# Patient Record
Sex: Female | Born: 1975 | Hispanic: Yes | Marital: Married | Smoking: Never smoker
Health system: Southern US, Community
[De-identification: ages and names within clinical notes are randomized; demographics above are authoritative.]

## PROBLEM LIST (undated history)

## (undated) DIAGNOSIS — R109 Unspecified abdominal pain: Secondary | ICD-10-CM

## (undated) DIAGNOSIS — K56609 Unspecified intestinal obstruction, unspecified as to partial versus complete obstruction: Secondary | ICD-10-CM

## (undated) DIAGNOSIS — Z8719 Personal history of other diseases of the digestive system: Secondary | ICD-10-CM

## (undated) DIAGNOSIS — K859 Acute pancreatitis without necrosis or infection, unspecified: Secondary | ICD-10-CM

## (undated) DIAGNOSIS — J45909 Unspecified asthma, uncomplicated: Secondary | ICD-10-CM

## (undated) DIAGNOSIS — G8929 Other chronic pain: Secondary | ICD-10-CM

## (undated) HISTORY — PX: LAPAROSCOPIC REVISION OF GASTRIC BAND: SHX5921

## (undated) HISTORY — PX: CHOLECYSTECTOMY: SHX55

## (undated) HISTORY — PX: LAPAROSCOPIC REPAIR AND REMOVAL OF GASTRIC BAND: SHX5919

---

## 2013-12-18 ENCOUNTER — Emergency Department (HOSPITAL_COMMUNITY)
Admission: EM | Admit: 2013-12-18 | Discharge: 2013-12-18 | Disposition: A | Discharge status: Home / Self Care | Payer: Self-pay | Attending: Emergency Medicine | Admitting: Emergency Medicine

## 2013-12-18 ENCOUNTER — Emergency Department (HOSPITAL_COMMUNITY)

## 2013-12-18 ENCOUNTER — Encounter (HOSPITAL_COMMUNITY): Payer: Self-pay | Admitting: Emergency Medicine

## 2013-12-18 DIAGNOSIS — J45909 Unspecified asthma, uncomplicated: Secondary | ICD-10-CM | POA: Insufficient documentation

## 2013-12-18 DIAGNOSIS — Z9089 Acquired absence of other organs: Secondary | ICD-10-CM | POA: Insufficient documentation

## 2013-12-18 DIAGNOSIS — R1033 Periumbilical pain: Secondary | ICD-10-CM | POA: Insufficient documentation

## 2013-12-18 DIAGNOSIS — Z3202 Encounter for pregnancy test, result negative: Secondary | ICD-10-CM | POA: Insufficient documentation

## 2013-12-18 DIAGNOSIS — Z880 Allergy status to penicillin: Secondary | ICD-10-CM | POA: Insufficient documentation

## 2013-12-18 DIAGNOSIS — G8929 Other chronic pain: Secondary | ICD-10-CM | POA: Insufficient documentation

## 2013-12-18 DIAGNOSIS — IMO0002 Reserved for concepts with insufficient information to code with codable children: Secondary | ICD-10-CM | POA: Insufficient documentation

## 2013-12-18 DIAGNOSIS — R1084 Generalized abdominal pain: Secondary | ICD-10-CM | POA: Insufficient documentation

## 2013-12-18 DIAGNOSIS — Z79899 Other long term (current) drug therapy: Secondary | ICD-10-CM | POA: Insufficient documentation

## 2013-12-18 DIAGNOSIS — Z91040 Latex allergy status: Secondary | ICD-10-CM | POA: Insufficient documentation

## 2013-12-18 DIAGNOSIS — Z8719 Personal history of other diseases of the digestive system: Secondary | ICD-10-CM | POA: Insufficient documentation

## 2013-12-18 DIAGNOSIS — R112 Nausea with vomiting, unspecified: Secondary | ICD-10-CM | POA: Insufficient documentation

## 2013-12-18 DIAGNOSIS — R1032 Left lower quadrant pain: Secondary | ICD-10-CM | POA: Insufficient documentation

## 2013-12-18 HISTORY — DX: Unspecified intestinal obstruction, unspecified as to partial versus complete obstruction: K56.609

## 2013-12-18 HISTORY — DX: Unspecified asthma, uncomplicated: J45.909

## 2013-12-18 HISTORY — DX: Unspecified abdominal pain: R10.9

## 2013-12-18 HISTORY — DX: Other chronic pain: G89.29

## 2013-12-18 HISTORY — DX: Personal history of other diseases of the digestive system: Z87.19

## 2013-12-18 HISTORY — DX: Acute pancreatitis without necrosis or infection, unspecified: K85.90

## 2013-12-18 LAB — COMPREHENSIVE METABOLIC PANEL
ALT: 18 U/L (ref 0–35)
AST: 25 U/L (ref 0–37)
Albumin: 3.7 g/dL (ref 3.5–5.2)
Alkaline Phosphatase: 120 U/L — ABNORMAL HIGH (ref 39–117)
BUN: 8 mg/dL (ref 6–23)
CO2: 26 mEq/L (ref 19–32)
CREATININE: 0.66 mg/dL (ref 0.50–1.10)
Calcium: 8.8 mg/dL (ref 8.4–10.5)
Chloride: 103 mEq/L (ref 96–112)
GFR calc Af Amer: >90 mL/min (ref >90)
GFR calc non Af Amer: >90 mL/min (ref >90)
Glucose, Bld: 87 mg/dL (ref 70–99)
Potassium: 3.8 mEq/L (ref 3.7–5.3)
Sodium: 139 mEq/L (ref 137–147)
Total Protein: 7.6 g/dL (ref 6.0–8.3)

## 2013-12-18 LAB — URINALYSIS, ROUTINE W REFLEX MICROSCOPIC
Bilirubin Urine: NEGATIVE
Glucose, UA: NEGATIVE mg/dL
Hgb urine dipstick: NEGATIVE
KETONES UR: NEGATIVE mg/dL
Leukocytes, UA: NEGATIVE
NITRITE: NEGATIVE
PROTEIN: NEGATIVE mg/dL
Specific Gravity, Urine: 1.027 (ref 1.005–1.030)
UROBILINOGEN UA: 0.2 mg/dL (ref 0.0–1.0)
pH: 6 (ref 5.0–8.0)

## 2013-12-18 LAB — PREGNANCY, URINE: Preg Test, Ur: NEGATIVE

## 2013-12-18 LAB — LIPASE, BLOOD: LIPASE: 46 U/L (ref 11–59)

## 2013-12-18 LAB — CBC
HEMATOCRIT: 34.4 % — AB (ref 36.0–46.0)
Hemoglobin: 11 g/dL — ABNORMAL LOW (ref 12.0–15.0)
MCH: 25.1 pg — ABNORMAL LOW (ref 26.0–34.0)
MCHC: 32 g/dL (ref 30.0–36.0)
MCV: 78.4 fL (ref 78.0–100.0)
Platelets: 334 10*3/uL (ref 150–400)
RBC: 4.39 MIL/uL (ref 3.87–5.11)
RDW: 15 % (ref 11.5–15.5)
WBC: 5.8 10*3/uL (ref 4.0–10.5)

## 2013-12-18 MED ORDER — DIAZEPAM 5 MG PO TABS: 5.0000 mg | ORAL_TABLET | Freq: Two times a day (BID) | ORAL | Status: AC

## 2013-12-18 MED ORDER — ONDANSETRON HCL 4 MG/2ML IJ SOLN
4.0000 mg | Freq: Once | INTRAMUSCULAR | Status: AC
  Administered 2013-12-18: 4 mg via INTRAVENOUS
  Filled 2013-12-18: qty 2

## 2013-12-18 MED ORDER — OXYCODONE-ACETAMINOPHEN 5-325 MG PO TABS: 1.0000 | ORAL_TABLET | ORAL | Status: AC | PRN

## 2013-12-18 MED ORDER — IOHEXOL 300 MG/ML  SOLN
50.0000 mL | Freq: Once | INTRAMUSCULAR | Status: AC | PRN
  Administered 2013-12-18: 50 mL via ORAL

## 2013-12-18 MED ORDER — PROMETHAZINE HCL 25 MG/ML IJ SOLN
12.5000 mg | Freq: Once | INTRAMUSCULAR | Status: AC
  Administered 2013-12-18: 12.5 mg via INTRAVENOUS
  Filled 2013-12-18: qty 1

## 2013-12-18 MED ORDER — IOHEXOL 300 MG/ML  SOLN
100.0000 mL | Freq: Once | INTRAMUSCULAR | Status: AC | PRN
  Administered 2013-12-18: 100 mL via INTRAVENOUS

## 2013-12-18 MED ORDER — PROMETHAZINE HCL 25 MG PO TABS: 25.0000 mg | ORAL_TABLET | Freq: Four times a day (QID) | ORAL | Status: AC | PRN

## 2013-12-18 MED ORDER — DIPHENHYDRAMINE HCL 50 MG/ML IJ SOLN
25.0000 mg | Freq: Once | INTRAMUSCULAR | Status: AC
  Administered 2013-12-18: 25 mg via INTRAVENOUS
  Filled 2013-12-18: qty 1

## 2013-12-18 MED ORDER — MORPHINE SULFATE 4 MG/ML IJ SOLN
4.0000 mg | Freq: Once | INTRAMUSCULAR | Status: AC
  Administered 2013-12-18: 4 mg via INTRAVENOUS
  Filled 2013-12-18: qty 1

## 2013-12-18 NOTE — Discharge Instructions (Signed)

## 2013-12-18 NOTE — ED Notes (Signed)
Pt states she is traveling and started getting sick on her way to [place]  Pt states she left her medication bag on the counter at home  Pt states she has hx of pancreatitis and this feels like a flare up  Pt states she has been having diarrhea and has a hx of anal fissures so she is having rectal pain as well  Pt states she has had multiple abd surgeries and has had diarrhea for the past 3 years  Pt has an extensive medical history

## 2013-12-18 NOTE — ED Provider Notes (Signed)
Medical screening examination/treatment/procedure(s) were performed by non-physician practitioner and as supervising physician I was immediately available for consultation/collaboration.   EKG Interpretation None       Martha K Linker, MD 12/18/13 2307 

## 2013-12-18 NOTE — ED Provider Notes (Signed)
CSN: [id number]     Arrival date & time 12/18/13  0037 History   First MD Initiated Contact with Patient 12/18/13 [phone]     Chief Complaint  Patient presents with  . Abdominal Pain   HPI  History provided by the patient. Patient is a 38 year old female with past history of multiple abdominal surgeries, bowel obstruction, pancreatitis, and chronic abdominal pain who presents with complaints of persistent and worsened abdominal pains with associated nausea and vomiting. Patient is in active service at [employer] but was [place] for her daughter's graduation [facility] [employer]. She is now traveling with her daughter to [place] where her daughter will begin a new assignment. Patient reports that after leaving from her trip she left all of her medications including her Percocet and OxyContin and has been traveling without these medicines. Yesterday she began having severe abdominal pain around the periumbilical area radiating to the back. Pain was associated with multiple frequent episodes of nausea and vomiting. Patient states that she is very concerned about her symptoms and that when they feel like this there is usually something bad going on. She is not sure if she is having a flareup of pancreatitis or having a bowel obstruction. She denies any constipation. No recent diarrhea. No other associated symptoms. No fever, chills or sweats. No urinary symptoms. No vaginal bleeding or vaginal discharge.    Past Medical History  Diagnosis Date  . Pancreatitis   . Asthma   . Chronic abdominal pain   . History of anal fissures   . Bowel obstruction    Past Surgical History  Procedure Laterality Date  . Cholecystectomy    . Laparoscopic repair and removal of gastric band    . Laparoscopic revision of gastric band     Family History  Problem Relation Age of Onset  . Cancer Mother   . Kidney failure Father   . Cancer Father   . CAD Father    History  Substance Use Topics  . Smoking status:  Never Smoker   . Smokeless tobacco: Not on file  . Alcohol Use: No   OB History   Grav Para Term Preterm Abortions TAB SAB Ect Mult Living                 Review of Systems  Constitutional: Negative for fever and diaphoresis.  Gastrointestinal: Positive for nausea, vomiting and abdominal pain. Negative for diarrhea, constipation and blood in stool.  Genitourinary: Negative for dysuria, frequency, hematuria, flank pain, vaginal bleeding and vaginal discharge.  All other systems reviewed and are negative.     Allergies  Motrin; Penicillins; Latex; and Soy allergy  Home Medications   Prior to Admission medications   Medication Sig Start Date End Date Taking? Authorizing Provider  albuterol (PROVENTIL HFA;VENTOLIN HFA) 108 (90 BASE) MCG/ACT inhaler Inhale 1-2 puffs into the lungs every 6 (six) hours as needed for wheezing or shortness of breath.   Yes Historical Provider, MD  albuterol (PROVENTIL) (2.5 MG/3ML) 0.083% nebulizer solution Take 2.5 mg by nebulization every 6 (six) hours as needed for wheezing or shortness of breath.   Yes Historical Provider, MD  diazepam (VALIUM) 5 MG tablet Take 5 mg by mouth daily as needed for anxiety.   Yes Historical Provider, MD  diphenhydrAMINE (BENADRYL) 25 mg capsule Take 50 mg by mouth every 6 (six) hours as needed (for agitation. Takes when she take phenergan).   Yes Historical Provider, MD  fluticasone (FLONASE) 50 MCG/ACT nasal spray  Place 1 spray into both nostrils daily.   Yes Historical Provider, MD  nystatin (MYCOSTATIN/NYSTOP) 100000 UNIT/GM POWD Apply 1 g topically 3 (three) times daily as needed (irritation).   Yes Historical Provider, MD  OxyCODONE (OXYCONTIN) 20 mg T12A 12 hr tablet Take 20 mg by mouth every 12 (twelve) hours as needed (pain).   Yes Historical Provider, MD  oxyCODONE-acetaminophen (PERCOCET) 10-325 MG per tablet Take 1 tablet by mouth every 6 (six) hours as needed for pain.   Yes Historical Provider, MD  pantoprazole  (PROTONIX) 40 MG tablet Take 40 mg by mouth every morning.   Yes Historical Provider, MD  promethazine (PHENERGAN) 25 MG tablet Take 25 mg by mouth every 12 (twelve) hours.   Yes Historical Provider, MD   BP 128/91  Pulse 80  Temp(Src) 98.5 F (36.9 C) (Oral)  Resp 18  Ht 5\' 2"  (1.575 m)  Wt 152 lb (68.947 kg)  BMI 27.79 kg/m2  SpO2 99%  LMP 11/30/2013 Physical Exam  Nursing note and vitals reviewed. Constitutional: She is oriented to person, place, and time. She appears well-developed and well-nourished. No distress.  HENT:  Head: Normocephalic.  Cardiovascular: Normal rate and regular rhythm.   Pulmonary/Chest: Effort normal and breath sounds normal. No respiratory distress.  Abdominal: Soft. There is no hepatosplenomegaly. There is tenderness in the periumbilical area and left lower quadrant. There is guarding. There is no rigidity, no rebound, no CVA tenderness, no tenderness at McBurney's point and negative Murphy's sign.  Musculoskeletal: Normal range of motion.  Neurological: She is alert and oriented to person, place, and time.  Skin: Skin is warm and dry. No rash noted.  Psychiatric: She has a normal mood and affect. Her behavior is normal.    ED Course  Procedures   COORDINATION OF CARE:  Nursing notes reviewed. Vital signs reviewed. Initial pt interview and examination performed.   Filed Vitals:   12/18/13 0101  BP: 128/91  Pulse: 80  Temp: 98.5 F (36.9 C)  TempSrc: Oral  Resp: 18  Height: 5\' 2"  (1.575 m)  Weight: 152 lb (68.947 kg)  SpO2: 99%    5:06 AM-patient seen and evaluated. Patient resting does not appear in any acute distress or severe pain or discomfort. Does have significant abdominal pains on exam. Her laboratory testing is unremarkable. Normal WBC. Normal lipase. Normal LFTs. Given her history of chronic abdominal pain have low suspicion for acute abdominal process. She does not have significant abdominal distention. Patient however is adamant  that she is very concerned about something going wrong in her abdomen. I discussed options for further evaluation including acute abdominal x-rays versus CT scan. Patient does feel that a CT scan would be helpful to find out what is going on. She understands the risks of high doses of radiation from multiple CT scans.    At this time will order CT for further evaluation.  Pt discussed in sign out with Elpidio Anis PA-C.  She will follow CT results.   Treatment plan initiated: Medications  ondansetron [facility]) injection 4 mg (4 mg Intravenous Given 12/18/13 0416)  promethazine (PHENERGAN) injection 12.5 mg (12.5 mg Intravenous Given 12/18/13 0519)  morphine 4 MG/ML injection 4 mg (4 mg Intravenous Given 12/18/13 0523)  diphenhydrAMINE (BENADRYL) injection 25 mg (25 mg Intravenous Given 12/18/13 0521)  iohexol (OMNIPAQUE) 300 MG/ML solution 50 mL (50 mLs Oral Contrast Given 12/18/13 0542)    Results for orders placed during the hospital encounter of 12/18/13  CBC  Result Value Ref Range   WBC 5.8  4.0 - 10.5 K/uL   RBC 4.39  3.87 - 5.11 MIL/uL   Hemoglobin 11.0 (*) 12.0 - 15.0 g/dL   HCT 78.234.4 (*) 95.636.0 - 21.346.0 %   MCV 78.4  78.0 - 100.0 fL   MCH 25.1 (*) 26.0 - 34.0 pg   MCHC 32.0  30.0 - 36.0 g/dL   RDW 08.615.0  57.811.5 - 46.915.5 %   Platelets 334  150 - 400 K/uL  COMPREHENSIVE METABOLIC PANEL      Result Value Ref Range   Sodium 139  137 - 147 mEq/L   Potassium 3.8  3.7 - 5.3 mEq/L   Chloride 103  96 - 112 mEq/L   CO2 26  19 - 32 mEq/L   Glucose, Bld 87  70 - 99 mg/dL   BUN 8  6 - 23 mg/dL   Creatinine, Ser 6.290.66  0.50 - 1.10 mg/dL   Calcium 8.8  8.4 - 52.810.5 mg/dL   Total Protein 7.6  6.0 - 8.3 g/dL   Albumin 3.7  3.5 - 5.2 g/dL   AST 25  0 - 37 U/L   ALT 18  0 - 35 U/L   Alkaline Phosphatase 120 (*) 39 - 117 U/L   Total Bilirubin <0.2 (*) 0.3 - 1.2 mg/dL   GFR calc non Af Amer >90  >90 mL/min   GFR calc Af Amer >90  >90 mL/min  LIPASE, BLOOD      Result Value Ref Range   Lipase 46   11 - 59 U/L  URINALYSIS, ROUTINE W REFLEX MICROSCOPIC      Result Value Ref Range   Color, Urine YELLOW  YELLOW   APPearance CLEAR  CLEAR   Specific Gravity, Urine 1.027  1.005 - 1.030   pH 6.0  5.0 - 8.0   Glucose, UA NEGATIVE  NEGATIVE mg/dL   Hgb urine dipstick NEGATIVE  NEGATIVE   Bilirubin Urine NEGATIVE  NEGATIVE   Ketones, ur NEGATIVE  NEGATIVE mg/dL   Protein, ur NEGATIVE  NEGATIVE mg/dL   Urobilinogen, UA 0.2  0.0 - 1.0 mg/dL   Nitrite NEGATIVE  NEGATIVE   Leukocytes, UA NEGATIVE  NEGATIVE  PREGNANCY, URINE      Result Value Ref Range   Preg Test, Ur NEGATIVE  NEGATIVE      Imaging Review No results found.   MDM   Final diagnoses:  None        Angus Sellereter S Dammen, PA-C 12/18/13 0559

## 2014-03-24 ENCOUNTER — Telehealth: Payer: Self-pay

## 2014-03-24 NOTE — Telephone Encounter (Signed)
TOLD PATIENT APPT DATE AND TIME - 05/11/14 AT 2:30PM WITH DR Clearance CootsHARPER

## 2014-05-11 ENCOUNTER — Ambulatory Visit: Payer: Medicaid Other | Admitting: Obstetrics

## 2015-06-24 IMAGING — CT CT ABD-PELV W/ CM
2 of 4 series · 17 of 46 positions shown, 19 images · IV contrast (omnipaque)
Comparison: None.

CLINICAL DATA: Abdominal pain.

EXAM:
CT ABDOMEN AND PELVIS WITH CONTRAST
TECHNIQUE: Multidetector CT imaging of the abdomen and pelvis was performed
using the standard protocol following bolus administration of
intravenous contrast.
CONTRAST:  100mL OMNIPAQUE IOHEXOL 300 MG/ML  SOLN

[Series 2: rtn a/p with · axial · 0.74mm/px · z∈[-486,-106]mm · 14 of 84 slices shown, 16 images]
[im 4/84  soft-tissue]
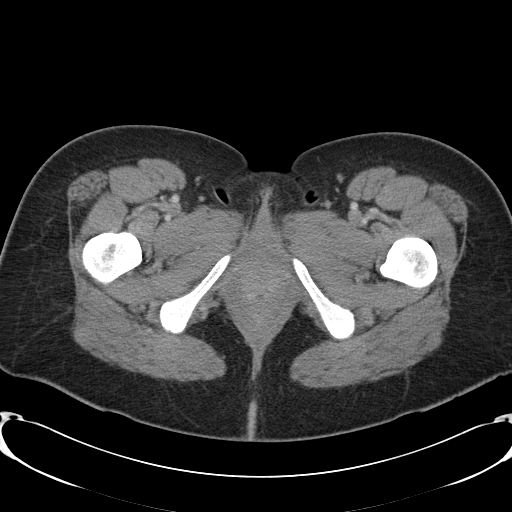
[im 4/84  bone]
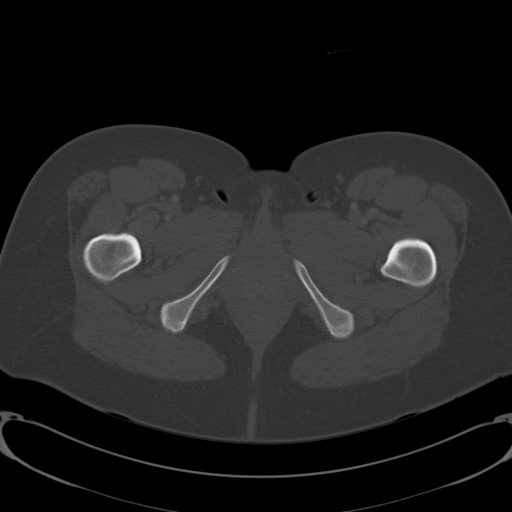
[im 10/84  soft-tissue]
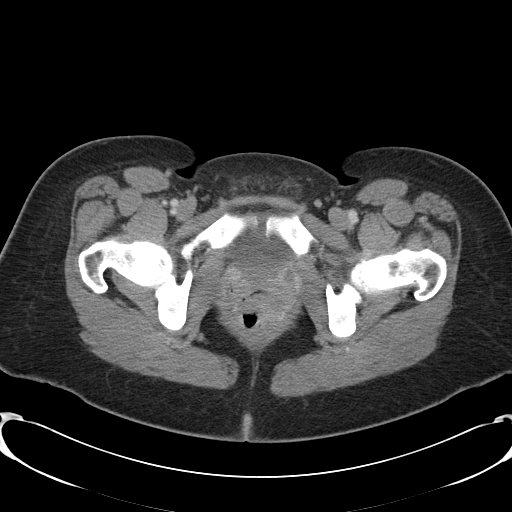
[im 16/84  soft-tissue]
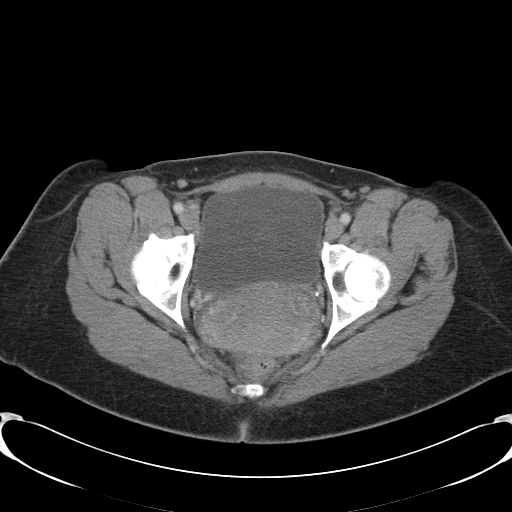
[im 23/84  soft-tissue]
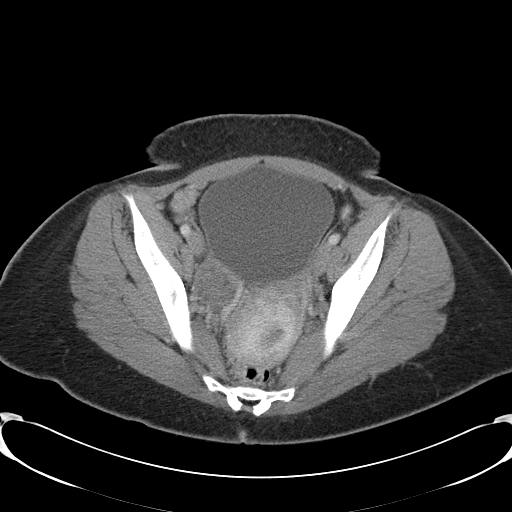
[im 29/84  soft-tissue]
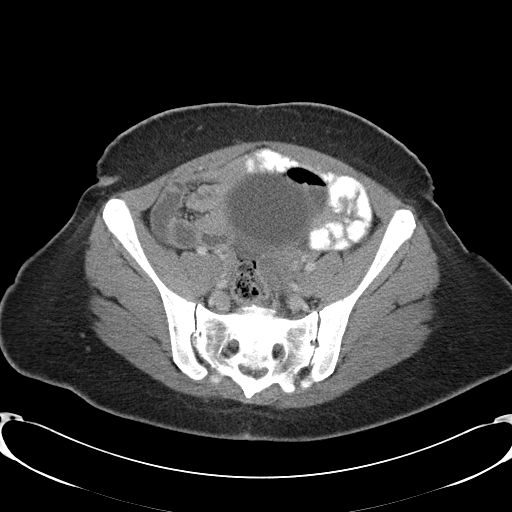
[im 32/84  soft-tissue]
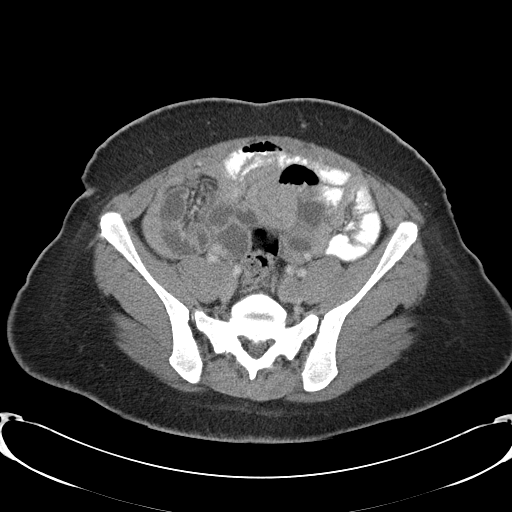
[im 39/84  soft-tissue]
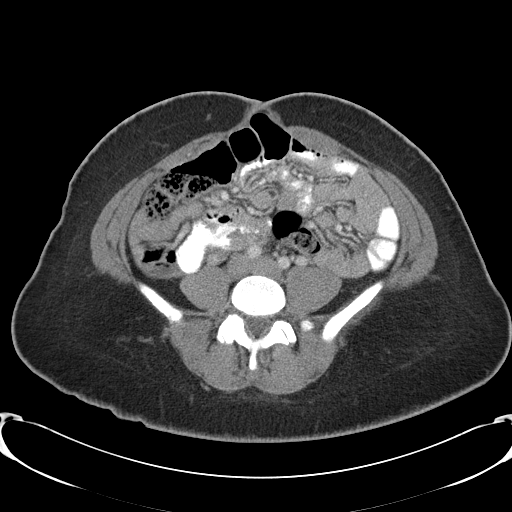
[im 45/84  soft-tissue]
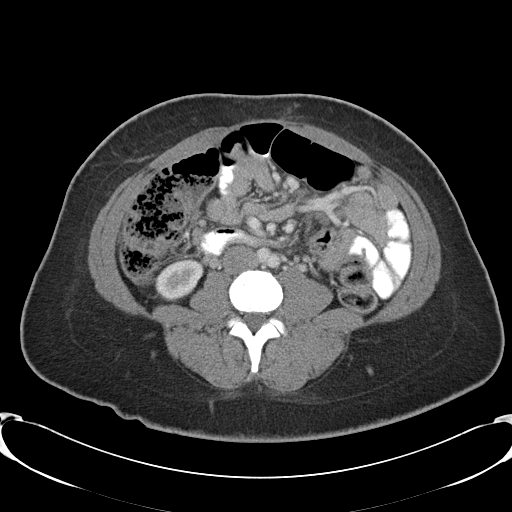
[im 52/84  soft-tissue]
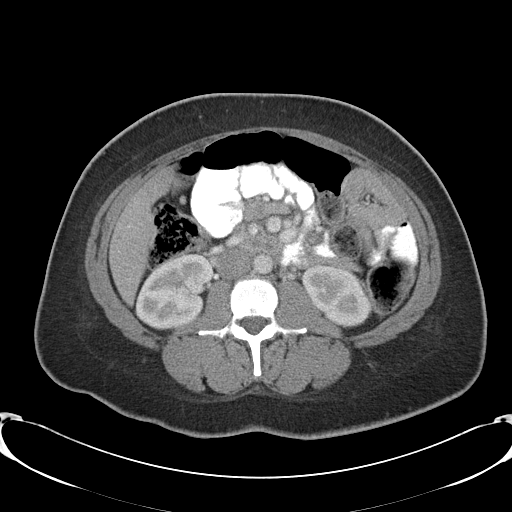
[im 52/84  bone]
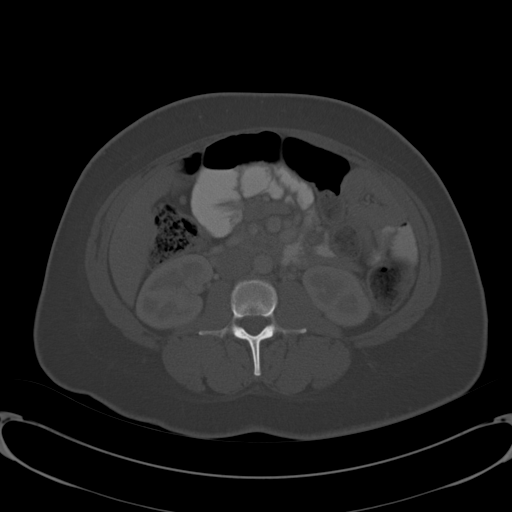
[im 55/84  soft-tissue]
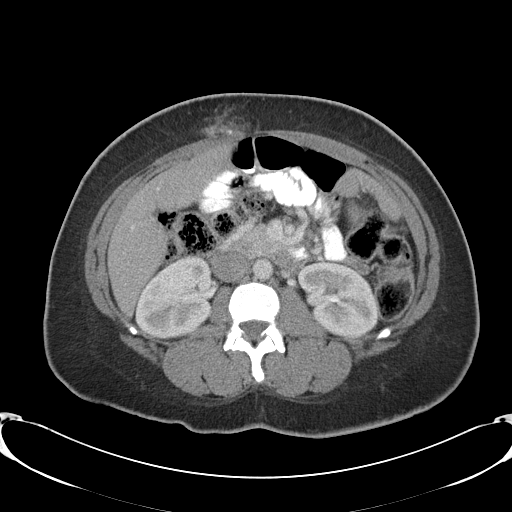
[im 61/84  soft-tissue]
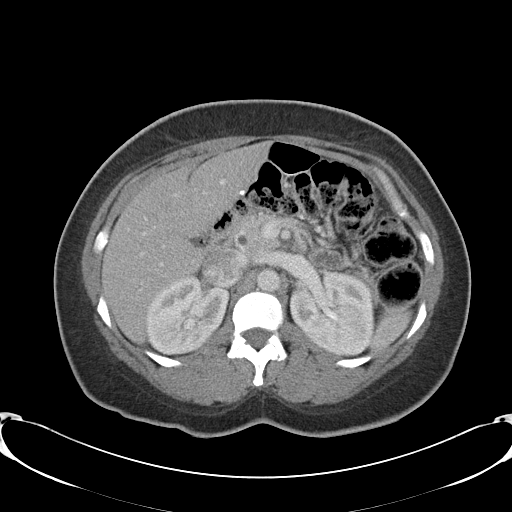
[im 68/84  soft-tissue]
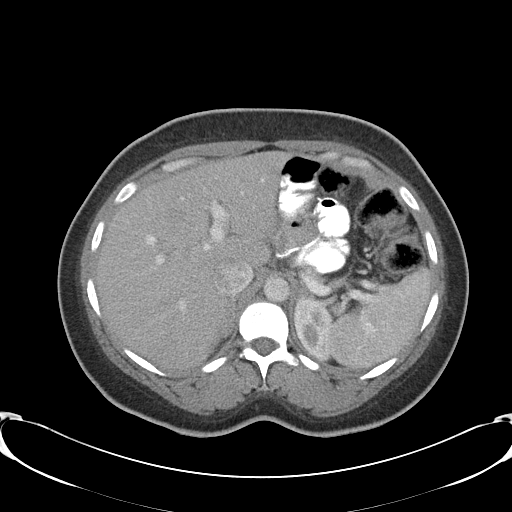
[im 74/84  soft-tissue]
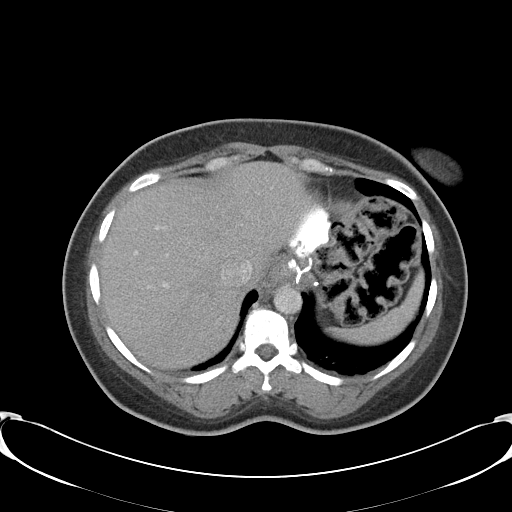
[im 80/84  soft-tissue]
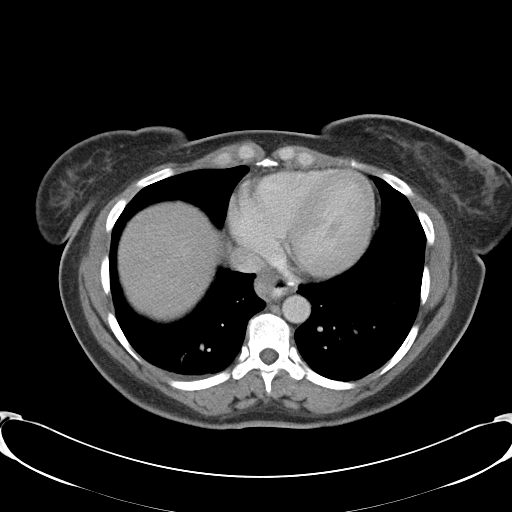

[Series 602: <mpr thick range> · coronal · 0.82mm/px · 3 of 87 slices shown]
[im 29/87  soft-tissue]
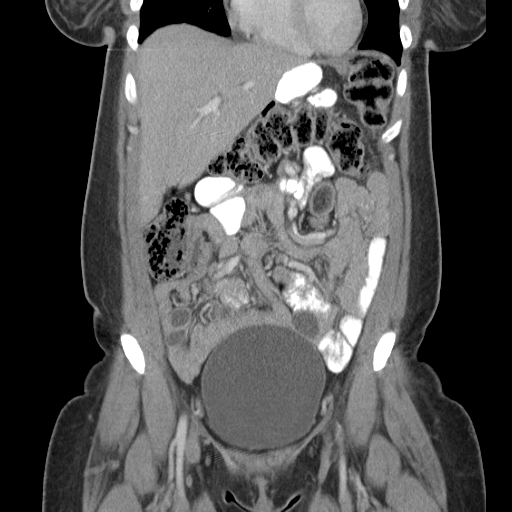
[im 39/87  soft-tissue]
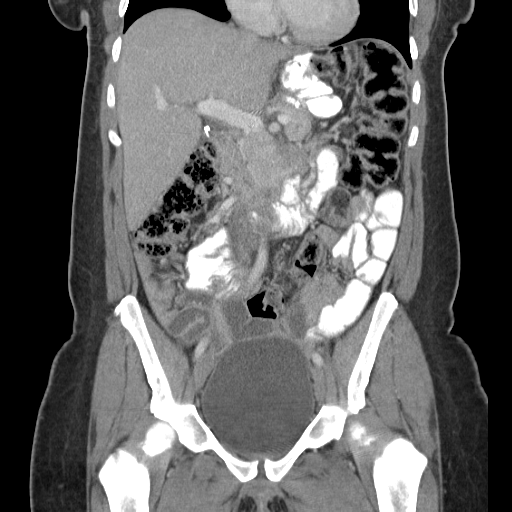
[im 48/87  soft-tissue]
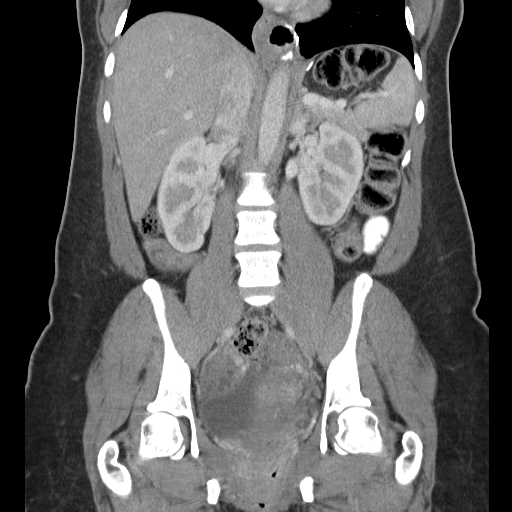

[17 of 46 positions shown; findings below may reference images not displayed]

FINDINGS: Visualized lung bases appear normal. No significant osseous
abnormality is noted.

Status post cholecystectomy. The liver, spleen and pancreas appear
normal. Status post gastric bypass procedure. Adrenal glands and
kidneys appear normal. No hydronephrosis or renal obstruction is
noted. Stool is noted throughout the colon. There is no evidence of
bowel obstruction or ileus. The appendix appears normal. No abnormal
fluid collection is noted. Uterus and ovaries appear normal. Mild
urinary bladder distention is noted. No significant adenopathy is
noted. Small fat containing ventral hernia is seen in the epigastric
region with surrounding inflammation. Surgical or ligation clip is
noted in the prerectal space of the pelvis.
IMPRESSION: Status post cholecystectomy and gastric bypass procedure.

Surgical or ligation clips seen in prerectal space of the pelvis.

No hydronephrosis or evidence or renal obstruction is noted.

Small fat containing ventral hernia is seen in the epigastric region
with surrounding inflammation.
